# Patient Record
Sex: Male | Born: 1965 | Race: White | Hispanic: No | Marital: Single | Smoking: Former smoker
Health system: Southern US, Community
[De-identification: ages and names within clinical notes are randomized; demographics above are authoritative.]

## PROBLEM LIST (undated history)

## (undated) DIAGNOSIS — E785 Hyperlipidemia, unspecified: Secondary | ICD-10-CM

## (undated) HISTORY — PX: HERNIA REPAIR: SHX51

## (undated) HISTORY — DX: Hyperlipidemia, unspecified: E78.5

---

## 2020-09-30 ENCOUNTER — Other Ambulatory Visit: Payer: Self-pay

## 2020-10-02 ENCOUNTER — Ambulatory Visit: Payer: Managed Care, Other (non HMO) | Admitting: Medical

## 2020-10-02 ENCOUNTER — Other Ambulatory Visit: Payer: Self-pay

## 2020-10-02 ENCOUNTER — Ambulatory Visit (HOSPITAL_BASED_OUTPATIENT_CLINIC_OR_DEPARTMENT_OTHER)
Admission: RE | Admit: 2020-10-02 | Discharge: 2020-10-02 | Disposition: A | Discharge status: Home / Self Care | Payer: Managed Care, Other (non HMO) | Source: Ambulatory Visit | Attending: Medical | Admitting: Medical

## 2020-10-02 ENCOUNTER — Encounter: Payer: Self-pay | Admitting: Medical

## 2020-10-02 VITALS — BP 132/74 | HR 84 | Temp 97.9°F | Resp 18 | Ht 66.0 in | Wt 213.0 lb

## 2020-10-02 DIAGNOSIS — Z113 Encounter for screening for infections with a predominantly sexual mode of transmission: Secondary | ICD-10-CM

## 2020-10-02 DIAGNOSIS — R0609 Other forms of dyspnea: Secondary | ICD-10-CM

## 2020-10-02 DIAGNOSIS — R0600 Dyspnea, unspecified: Secondary | ICD-10-CM | POA: Diagnosis present

## 2020-10-02 DIAGNOSIS — Z1211 Encounter for screening for malignant neoplasm of colon: Secondary | ICD-10-CM | POA: Diagnosis not present

## 2020-10-02 DIAGNOSIS — Z125 Encounter for screening for malignant neoplasm of prostate: Secondary | ICD-10-CM | POA: Diagnosis not present

## 2020-10-02 DIAGNOSIS — Z23 Encounter for immunization: Secondary | ICD-10-CM | POA: Diagnosis not present

## 2020-10-02 DIAGNOSIS — Z0000 Encounter for general adult medical examination without abnormal findings: Secondary | ICD-10-CM

## 2020-10-02 NOTE — Addendum Note (Signed)
Addended by: Maximino Sarin on: 10/02/2020 03:04 PM   Modules accepted: Orders

## 2020-10-02 NOTE — Progress Notes (Signed)
Subjective:    Patient ID: Jerome Wilson, male    DOB: [birth year], 55 y.o.   MRN: [id number]  HPI  Pt in for the first time.  Pt states no pcp.   Pt works Primary school teacher. No exercise apart from work. Pt used to smoke. Stopped 22 months ago. But states occurs very random at times. Clarifies and states last event 3 weeks ago.  Pt states rare intermittent shortness of breath. Pt does not note any wheeze. This was last summer.Vey minimal shortness of breath as friend told him he looked mild sob.  No chest pain reported. No associated cardiac signs or symptoms.  Pt had j and j vaccine in past and he had omicron.   Pt will get tdap today.  Pt state fh dad and brother had cardiovascularl vascular disease Dad had mi. Passed away at 40 yo from stroke.    Review of Systems  Constitutional: Negative for chills, fatigue and fever.  HENT: Negative for congestion and drooling.   Respiratory: Negative for cough, chest tightness, shortness of breath and wheezing.   Cardiovascular: Negative for chest pain and palpitations.  Genitourinary: Negative for dysuria and frequency.  Musculoskeletal: Negative for back pain, joint swelling and myalgias.  Neurological: Negative for dizziness, syncope, light-headedness and headaches.  Hematological: Negative for adenopathy. Does not bruise/bleed easily.  Psychiatric/Behavioral: Negative for behavioral problems and confusion.    No past medical history on file.   Social History   Socioeconomic History  . Marital status: Single    Spouse name: Not on file  . Number of children: Not on file  . Years of education: Not on file  . Highest education level: Not on file  Occupational History  . Not on file  Tobacco Use  . Smoking status: Former Smoker    Packs/day: 1.00    Years: 17.00    Pack years: 17.00    Types: Cigarettes    Quit date: 12/18/2018    Years since quitting: 1.7  . Smokeless tobacco: Never Used  Vaping Use  . Vaping  Use: Never used  Substance and Sexual Activity  . Alcohol use: Yes    Comment: rare alcohol.   . Drug use: Never  . Sexual activity: Never  Other Topics Concern  . Not on file  Social History Narrative  . Not on file   Social Determinants of Health   Financial Resource Strain: Not on file  Food Insecurity: Not on file  Transportation Needs: Not on file  Physical Activity: Not on file  Stress: Not on file  Social Connections: Not on file  Intimate Partner Violence: Not on file     No family history on file.  Not on File  No current outpatient medications on file prior to visit.   No current facility-administered medications on file prior to visit.    BP 132/74   Pulse 84   Temp 97.9 F (36.6 C)   Resp 18   Ht 5\' 6"  (1.676 m)   Wt 213 lb (96.6 kg)   SpO2 98%   BMI 34.38 kg/m       Objective:   Physical Exam  General Mental Status- Alert. General Appearance- Not in acute distress.   Skin General: Color- Normal Color. Moisture- Normal Moisture.  Neck Carotid Arteries- Normal color. Moisture- Normal Moisture. No carotid bruits. No JVD.  Chest and Lung Exam Auscultation: Breath Sounds:-Normal.  Cardiovascular Auscultation:Rythm- Regular. Murmurs & Other Heart Sounds:Auscultation of the heart reveals- No Murmurs.  Abdomen  Inspection:-Inspeection Normal. Palpation/Percussion:Note:No mass. Palpation and Percussion of the abdomen reveal- Non Tender, Non Distended + BS, no rebound or guarding.    Neurologic Cranial Nerve exam:- CN III-XII intact(No nystagmus), symmetric smile. Strength:- 5/5 equal and symmetric strength both upper and lower extremities.      Assessment & Plan:  For you wellness exam today I have ordered cbc, cmp,lipid panel and psa. Future labs fasting.  Vaccine given today tdap.  Recommend exercise and healthy diet.  We will let you know lab results as they come in.  Follow up 10 days or as needed  For your rare dyspnea on  exertion we got ekg and chest xray. After lab review and follow up decide on if need to refer you to cardiologist.

## 2020-10-02 NOTE — Patient Instructions (Addendum)
For you wellness exam today I have ordered cbc, cmp,lipid panel and psa. Future labs fasting.  Vaccine given today tdap.  Recommend exercise and healthy diet.  We will let you know lab results as they come in.  Follow up 10 days or as needed  For your rare dyspnea on exertion we got ekg  nsr and chest xray. After lab review and follow up decide on if need to refer you to cardiologist.   Preventive Care 34-55 Years Old, Male Preventive care refers to lifestyle choices and visits with your health care provider that can promote health and wellness. This includes:  A yearly physical exam. This is also called an annual wellness visit.  Regular dental and eye exams.  Immunizations.  Screening for certain conditions.  Healthy lifestyle choices, such as: ? Eating a healthy diet. ? Getting regular exercise. ? Not using drugs or products that contain nicotine and tobacco. ? Limiting alcohol use. What can I expect for my preventive care visit? Physical exam Your health care provider will check your:  Height and weight. These may be used to calculate your BMI (body mass index). BMI is a measurement that tells if you are at a healthy weight.  Heart rate and blood pressure.  Body temperature.  Skin for abnormal spots. Counseling Your health care provider may ask you questions about your:  Past medical problems.  Family's medical history.  Alcohol, tobacco, and drug use.  Emotional well-being.  Home life and relationship well-being.  Sexual activity.  Diet, exercise, and sleep habits.  Work and work Astronomer.  Access to firearms. What immunizations do I need? Vaccines are usually given at various ages, according to a schedule. Your health care provider will recommend vaccines for you based on your age, medical history, and lifestyle or other factors, such as travel or where you work.   What tests do I need? Blood tests  Lipid and cholesterol levels. These may be  checked every 5 years, or more often if you are over 53 years old.  Hepatitis C test.  Hepatitis B test. Screening  Lung cancer screening. You may have this screening every year starting at age 55 if you have a 30-pack-year history of smoking and currently smoke or have quit within the past 15 years.  Prostate cancer screening. Recommendations will vary depending on your family history and other risks.  Genital exam to check for testicular cancer or hernias.  Colorectal cancer screening. ? All adults should have this screening starting at age 76 and continuing until age 46. ? Your health care provider may recommend screening at age 44 if you are at increased risk. ? You will have tests every 1-10 years, depending on your results and the type of screening test.  Diabetes screening. ? This is done by checking your blood sugar (glucose) after you have not eaten for a while (fasting). ? You may have this done every 1-3 years.  STD (sexually transmitted disease) testing, if you are at risk. Follow these instructions at home: Eating and drinking  Eat a diet that includes fresh fruits and vegetables, whole grains, lean protein, and low-fat dairy products.  Take vitamin and mineral supplements as recommended by your health care provider.  Do not drink alcohol if your health care provider tells you not to drink.  If you drink alcohol: ? Limit how much you have to 0-2 drinks a day. ? Be aware of how much alcohol is in your drink. In the [place] one drink  equals one 12 oz bottle of beer (355 mL), one 5 oz glass of wine (148 mL), or one 1 oz glass of hard liquor (44 mL).   Lifestyle  Take daily care of your teeth and gums. Brush your teeth every morning and night with fluoride toothpaste. Floss one time each day.  Stay active. Exercise for at least 30 minutes 5 or more days each week.  Do not use any products that contain nicotine or tobacco, such as cigarettes, e-cigarettes, and chewing  tobacco. If you need help quitting, ask your health care provider.  Do not use drugs.  If you are sexually active, practice safe sex. Use a condom or other form of protection to prevent STIs (sexually transmitted infections).  If told by your health care provider, take low-dose aspirin daily starting at age 96.  Find healthy ways to cope with stress, such as: ? Meditation, yoga, or listening to music. ? Journaling. ? Talking to a trusted person. ? Spending time with friends and family. Safety  Always wear your seat belt while driving or riding in a vehicle.  Do not drive: ? If you have been drinking alcohol. Do not ride with someone who has been drinking. ? When you are tired or distracted. ? While texting.  Wear a helmet and other protective equipment during sports activities.  If you have firearms in your house, make sure you follow all gun safety procedures. What's next?  Go to your health care provider once a year for an annual wellness visit.  Ask your health care provider how often you should have your eyes and teeth checked.  Stay up to date on all vaccines. This information is not intended to replace advice given to you by your health care provider. Make sure you discuss any questions you have with your health care provider. Document Revised: 04/23/2019 Document Reviewed: 07/19/2018 Elsevier Patient Education  2021 [employer].

## 2020-10-03 ENCOUNTER — Telehealth: Payer: Self-pay | Admitting: Medical

## 2020-10-03 MED ORDER — ALBUTEROL SULFATE HFA 108 (90 BASE) MCG/ACT IN AERS
2.0000 | INHALATION_SPRAY | Freq: Four times a day (QID) | RESPIRATORY_TRACT | 0 refills | Status: DC | PRN
Start: 2020-10-03 — End: 2020-10-28

## 2020-10-03 NOTE — Telephone Encounter (Signed)
Rx albuterol inhaler sent to pt pharmacy. 

## 2020-10-06 ENCOUNTER — Other Ambulatory Visit: Payer: Self-pay

## 2020-10-06 ENCOUNTER — Encounter: Payer: Self-pay | Admitting: Gastroenterology

## 2020-10-06 ENCOUNTER — Other Ambulatory Visit (INDEPENDENT_AMBULATORY_CARE_PROVIDER_SITE_OTHER): Payer: Managed Care, Other (non HMO)

## 2020-10-06 ENCOUNTER — Telehealth: Payer: Self-pay | Admitting: Medical

## 2020-10-06 DIAGNOSIS — E785 Hyperlipidemia, unspecified: Secondary | ICD-10-CM

## 2020-10-06 DIAGNOSIS — Z125 Encounter for screening for malignant neoplasm of prostate: Secondary | ICD-10-CM | POA: Diagnosis not present

## 2020-10-06 DIAGNOSIS — Z0000 Encounter for general adult medical examination without abnormal findings: Secondary | ICD-10-CM | POA: Diagnosis not present

## 2020-10-06 DIAGNOSIS — Z113 Encounter for screening for infections with a predominantly sexual mode of transmission: Secondary | ICD-10-CM

## 2020-10-06 DIAGNOSIS — Z8249 Family history of ischemic heart disease and other diseases of the circulatory system: Secondary | ICD-10-CM

## 2020-10-06 DIAGNOSIS — R0609 Other forms of dyspnea: Secondary | ICD-10-CM

## 2020-10-06 DIAGNOSIS — R0600 Dyspnea, unspecified: Secondary | ICD-10-CM

## 2020-10-06 LAB — CBC WITH DIFFERENTIAL/PLATELET
Basophils Absolute: 0.1 10*3/uL (ref 0.0–0.1)
Basophils Relative: 1.2 % (ref 0.0–3.0)
Eosinophils Absolute: 0.3 10*3/uL (ref 0.0–0.7)
Eosinophils Relative: 7.4 % — ABNORMAL HIGH (ref 0.0–5.0)
HCT: 43.9 % (ref 39.0–52.0)
Hemoglobin: 14.9 g/dL (ref 13.0–17.0)
Lymphocytes Relative: 29.8 % (ref 12.0–46.0)
Lymphs Abs: 1.3 10*3/uL (ref 0.7–4.0)
MCHC: 33.9 g/dL (ref 30.0–36.0)
MCV: 85.6 fl (ref 78.0–100.0)
Monocytes Absolute: 0.6 10*3/uL (ref 0.1–1.0)
Monocytes Relative: 14.7 % — ABNORMAL HIGH (ref 3.0–12.0)
Neutro Abs: 2 10*3/uL (ref 1.4–7.7)
Neutrophils Relative %: 46.9 % (ref 43.0–77.0)
Platelets: 168 10*3/uL (ref 150.0–400.0)
RBC: 5.13 Mil/uL (ref 4.22–5.81)
RDW: 13.4 % (ref 11.5–15.5)
WBC: 4.2 10*3/uL (ref 4.0–10.5)

## 2020-10-06 LAB — COMPREHENSIVE METABOLIC PANEL
ALT: 38 U/L (ref 0–53)
AST: 24 U/L (ref 0–37)
Albumin: 4.4 g/dL (ref 3.5–5.2)
Alkaline Phosphatase: 47 U/L (ref 39–117)
BUN: 16 mg/dL (ref 6–23)
CO2: 28 mEq/L (ref 19–32)
Calcium: 9.4 mg/dL (ref 8.4–10.5)
Chloride: 103 mEq/L (ref 96–112)
Creatinine, Ser: 1.09 mg/dL (ref 0.40–1.50)
GFR: 77 mL/min (ref >60.00)
Glucose, Bld: 103 mg/dL — ABNORMAL HIGH (ref 70–99)
Potassium: 4.4 mEq/L (ref 3.5–5.1)
Sodium: 139 mEq/L (ref 135–145)
Total Bilirubin: 0.6 mg/dL (ref 0.2–1.2)
Total Protein: 7.1 g/dL (ref 6.0–8.3)

## 2020-10-06 LAB — LIPID PANEL
Cholesterol: 203 mg/dL — ABNORMAL HIGH (ref 0–200)
HDL: 30.5 mg/dL — ABNORMAL LOW (ref >39.00)
NonHDL: 172.67
Total CHOL/HDL Ratio: 7
Triglycerides: 323 mg/dL — ABNORMAL HIGH (ref 0.0–149.0)
VLDL: 64.6 mg/dL — ABNORMAL HIGH (ref 0.0–40.0)

## 2020-10-06 LAB — PSA: PSA: 1.49 ng/mL (ref 0.10–4.00)

## 2020-10-06 LAB — LDL CHOLESTEROL, DIRECT: Direct LDL: 117 mg/dL

## 2020-10-06 MED ORDER — ATORVASTATIN CALCIUM 10 MG PO TABS: 10.0000 mg | ORAL_TABLET | Freq: Every day | ORAL | 3 refills | Status: DC

## 2020-10-06 NOTE — Telephone Encounter (Signed)
Atorvastatin sent to pt pharmacy. 

## 2020-10-06 NOTE — Telephone Encounter (Signed)
Referral to cardiologist placed. 

## 2020-10-07 LAB — HIV ANTIBODY (ROUTINE TESTING W REFLEX): HIV 1&2 Ab, 4th Generation: NONREACTIVE

## 2020-10-12 ENCOUNTER — Ambulatory Visit: Payer: Managed Care, Other (non HMO) | Admitting: Medical

## 2020-10-12 DIAGNOSIS — Z0289 Encounter for other administrative examinations: Secondary | ICD-10-CM

## 2020-10-13 ENCOUNTER — Other Ambulatory Visit: Payer: Self-pay

## 2020-10-13 ENCOUNTER — Encounter: Payer: Self-pay | Admitting: Medical

## 2020-10-13 ENCOUNTER — Ambulatory Visit: Payer: Managed Care, Other (non HMO) | Admitting: Medical

## 2020-10-13 VITALS — BP 132/89 | HR 97 | Resp 18 | Ht 66.0 in | Wt 207.6 lb

## 2020-10-13 DIAGNOSIS — R739 Hyperglycemia, unspecified: Secondary | ICD-10-CM

## 2020-10-13 DIAGNOSIS — E785 Hyperlipidemia, unspecified: Secondary | ICD-10-CM

## 2020-10-13 NOTE — Progress Notes (Signed)
Subjective:    Patient ID: Jerome Wilson, male    DOB: 10-28-1965, 55 y.o.   MRN: [id number]  HPI  Pt in for follow up.  Pt had labs done. I had explained pt 10 year cardiovascular risk score and we had discussed pt family history. He wants to avoid statin. He wants to eat better first.  The 10-year ASCVD risk score Denman George DC Montez Hageman., et al., 2013) is: 7.6%   Values used to calculate the score:     Age: 20 years     Sex: Male     Is Non-Hispanic African American: No     Diabetic: No     Tobacco smoker: No     Systolic Blood Pressure: 123 mmHg     Is BP treated: No     HDL Cholesterol: 30.5 mg/dL     Total Cholesterol: 203 mg/dL    Pt has mild sugar elevation in the past.   I did put in referral to cardiologist. Did discuss I think good idea to get cardiologist opinion.     Review of Systems  Constitutional: Negative for chills, fatigue and fever.  HENT: Negative for congestion and drooling.   Respiratory: Negative for cough, chest tightness, shortness of breath and wheezing.   Cardiovascular: Negative for chest pain and palpitations.  Gastrointestinal: Negative for abdominal pain, blood in stool, diarrhea, nausea and rectal pain.  Genitourinary: Negative for dysuria.  Musculoskeletal: Negative for back pain and neck pain.  Skin: Negative for rash.  Neurological: Negative for dizziness, numbness and headaches.  Hematological: Negative for adenopathy. Does not bruise/bleed easily.  Psychiatric/Behavioral: Negative for behavioral problems, confusion and decreased concentration. The patient is not nervous/anxious and is not hyperactive.    No past medical history on file.   Social History   Socioeconomic History  . Marital status: Single    Spouse name: Not on file  . Number of children: Not on file  . Years of education: Not on file  . Highest education level: Not on file  Occupational History  . Not on file  Tobacco Use  . Smoking status: Former Smoker     Packs/day: 1.00    Years: 17.00    Pack years: 17.00    Types: Cigarettes    Quit date: 12/18/2018    Years since quitting: 1.8  . Smokeless tobacco: Never Used  Vaping Use  . Vaping Use: Never used  Substance and Sexual Activity  . Alcohol use: Yes    Comment: rare alcohol.   . Drug use: Never  . Sexual activity: Never  Other Topics Concern  . Not on file  Social History Narrative  . Not on file   Social Determinants of Health   Financial Resource Strain: Not on file  Food Insecurity: Not on file  Transportation Needs: Not on file  Physical Activity: Not on file  Stress: Not on file  Social Connections: Not on file  Intimate Partner Violence: Not on file     No family history on file.  Not on File  Current Outpatient Medications on File Prior to Visit  Medication Sig Dispense Refill  . albuterol (VENTOLIN HFA) 108 (90 Base) MCG/ACT inhaler Inhale 2 puffs into the lungs every 6 (six) hours as needed. 18 g 0  . atorvastatin (LIPITOR) 10 MG tablet Take 1 tablet (10 mg total) by mouth daily. 30 tablet 3   No current facility-administered medications on file prior to visit.    BP (!) 123/101  Pulse 97   Resp 18   Ht 5\' 6"  (1.676 m)   Wt 207 lb 9.6 oz (94.2 kg)   SpO2 96%   BMI 33.51 kg/m       Objective:   Physical Exam   General- No acute distress. Pleasant patient. Neck- Full range of motion, no jvd Lungs- Clear, even and unlabored. Heart- regular rate and rhythm. Neurologic- CNII- XII grossly intact.        Assessment & Plan:  For your high cholesterol recommend low cholesterol diet and mild exercise. I prescribed statin but you are hesitant to use. In light of dad medical history will refer to cardiologist in light of rare sob as we discussed on last visit.  For elevated sugar recommend low sugar diet.   For weight loss recommend weight watchers.  Follow up in 3-4 months or as needed

## 2020-10-13 NOTE — Patient Instructions (Addendum)
For your high cholesterol recommend low cholesterol diet and mild exercise. I prescribed statin but you are hesitant to use. In light of dad medical history will refer to cardiologist in light of rare sob as we discussed on last visit.  For elevated sugar recommend low sugar diet.   For weight loss recommend weight watchers.  Follow up in 3-4 months or as needed  Future cmp and lipid panel placed.Please schedule lab in 3 months.

## 2020-10-28 ENCOUNTER — Other Ambulatory Visit: Payer: Self-pay

## 2020-10-28 ENCOUNTER — Ambulatory Visit: Payer: Managed Care, Other (non HMO) | Admitting: Cardiology

## 2020-10-28 ENCOUNTER — Encounter: Payer: Self-pay | Admitting: Cardiology

## 2020-10-28 VITALS — BP 118/84 | HR 98 | Ht 66.0 in | Wt 208.0 lb

## 2020-10-28 DIAGNOSIS — E669 Obesity, unspecified: Secondary | ICD-10-CM | POA: Insufficient documentation

## 2020-10-28 DIAGNOSIS — R0602 Shortness of breath: Secondary | ICD-10-CM | POA: Diagnosis not present

## 2020-10-28 DIAGNOSIS — E785 Hyperlipidemia, unspecified: Secondary | ICD-10-CM

## 2020-10-28 DIAGNOSIS — E781 Pure hyperglyceridemia: Secondary | ICD-10-CM | POA: Insufficient documentation

## 2020-10-28 NOTE — Progress Notes (Signed)
Cardiology Office Note:    Date:  10/28/2020   ID:  Jerome PunaJohnny Alia, DOB 06/11/1966, MRN [id number]  PCP:  Esperanza RichtersSaguier, Edward, PA-C  Cardiologist:  Thomasene RippleKardie Tayra Dawe, DO  Electrophysiologist:  None   Referring MD: Esperanza RichtersSaguier, Edward, PA-C   Result her shortness of breath which seems to be improving  History of Present Illness:    Jerome Wilson is a 55 y.o. male with a hx of hyperlipidemia, former smoker, family history of heart disease in his father at the age of 55, heart disease in his brother, obesity here today to be evaluated for shortness of breath.  The patient tells me that several months ago he had been experiencing significant shortness of breath which recently he is gotten little active and is improving.  He know he discussed with his PCP who recommended he see cardiology.  He denies any chest pain any lightheadedness and dizziness.  Past Medical History:  Diagnosis Date  . Hyperlipidemia     Past Surgical History:  Procedure Laterality Date  . HERNIA REPAIR      Current Medications: No outpatient medications have been marked as taking for the 10/28/20 encounter (Office Visit) with Thomasene Rippleobb, Sheridan Hew, DO.     Allergies:   Patient has no known allergies.   Social History   Socioeconomic History  . Marital status: Single    Spouse name: Not on file  . Number of children: Not on file  . Years of education: Not on file  . Highest education level: Not on file  Occupational History  . Not on file  Tobacco Use  . Smoking status: Former Smoker    Packs/day: 1.00    Years: 17.00    Pack years: 17.00    Types: Cigarettes    Quit date: 12/18/2018    Years since quitting: 1.8  . Smokeless tobacco: Never Used  Vaping Use  . Vaping Use: Never used  Substance and Sexual Activity  . Alcohol use: Yes    Comment: rare alcohol.   . Drug use: Never  . Sexual activity: Never  Other Topics Concern  . Not on file  Social History Narrative  . Not on file   Social Determinants of  Health   Financial Resource Strain: Not on file  Food Insecurity: Not on file  Transportation Needs: Not on file  Physical Activity: Not on file  Stress: Not on file  Social Connections: Not on file     Family History: The patient's family history includes Arthritis in his sister; Heart attack in his father; Heart disease in his brother and mother; Stroke (age of onset: 6355) in his father.  ROS:   Review of Systems  Constitution: Negative for decreased appetite, fever and weight gain.  HENT: Negative for congestion, ear discharge, hoarse voice and sore throat.   Eyes: Negative for discharge, redness, vision loss in right eye and visual halos.  Cardiovascular: Negative for chest pain, dyspnea on exertion, leg swelling, orthopnea and palpitations.  Respiratory: Negative for cough, hemoptysis, shortness of breath and snoring.   Endocrine: Negative for heat intolerance and polyphagia.  Hematologic/Lymphatic: Negative for bleeding problem. Does not bruise/bleed easily.  Skin: Negative for flushing, nail changes, rash and suspicious lesions.  Musculoskeletal: Negative for arthritis, joint pain, muscle cramps, myalgias, neck pain and stiffness.  Gastrointestinal: Negative for abdominal pain, bowel incontinence, diarrhea and excessive appetite.  Genitourinary: Negative for decreased libido, genital sores and incomplete emptying.  Neurological: Negative for brief paralysis, focal weakness, headaches and loss of balance.  Psychiatric/Behavioral: Negative for altered mental status, depression and suicidal ideas.  Allergic/Immunologic: Negative for HIV exposure and persistent infections.    EKGs/Labs/Other Studies Reviewed:    The following studies were reviewed today:   EKG: None today but I reviewed his EKG which was done on October 02, 2020 sinus rhythm no ST segment changes.  Recent Labs: 10/06/2020: ALT 38; BUN 16; Creatinine, Ser 1.09; Hemoglobin 14.9; Platelets 168.0; Potassium 4.4;  Sodium 139  Recent Lipid Panel    Component Value Date/Time   CHOL 203 (H) 10/06/2020 0920   TRIG 323.0 (H) 10/06/2020 0920   HDL 30.50 (L) 10/06/2020 0920   CHOLHDL 7 10/06/2020 0920   VLDL 64.6 (H) 10/06/2020 0920   LDLDIRECT 117.0 10/06/2020 0920    Physical Exam:    VS:  BP 118/84   Pulse 98   Ht 5\' 6"  (1.676 m)   Wt 208 lb (94.3 kg)   SpO2 96%   BMI 33.57 kg/m     Wt Readings from Last 3 Encounters:  10/28/20 208 lb (94.3 kg)  10/13/20 207 lb 9.6 oz (94.2 kg)  10/02/20 213 lb (96.6 kg)     GEN: Well nourished, well developed in no acute distress HEENT: Normal NECK: No JVD; No carotid bruits LYMPHATICS: No lymphadenopathy CARDIAC: S1S2 noted,RRR, no murmurs, rubs, gallops RESPIRATORY:  Clear to auscultation without rales, wheezing or rhonchi  ABDOMEN: Soft, non-tender, non-distended, +bowel sounds, no guarding. EXTREMITIES: No edema, No cyanosis, no clubbing MUSCULOSKELETAL:  No deformity  SKIN: Warm and dry NEUROLOGIC:  Alert and oriented x 3, non-focal PSYCHIATRIC:  Normal affect, good insight  ASSESSMENT:    1. Hyperlipidemia, unspecified hyperlipidemia type   2. Hypertriglyceridemia   3. Shortness of breath   4. Obesity (BMI 30-39.9)    PLAN:     For shortness of breath, echocardiogram will also be done to assess LV/RV function and any other structure abnormalities.  This also help understand his right and left-sided pressures given his history of smoking.  He has no other typical anginal symptoms we will continue to monitor.  And if needed we will reassess the need for any further ischemic evaluation.  He does have dyslipidemia with hypertriglyceridemia total triglycerides 323, HDL 30, total cholesterol 10/04/20 LDL 117.  He was recently started on Lipitor by his PCP.  Per the patient at this point will rather hold off on medication and like to cut back on new things in his life.  We discussed he is heavily on sweets as well as sometimes drinks beer he is can  call out this back and hopefully see if this is going to help.  He intends to improve his diet and also start exercising.  This is not unreasonable he is going to get his repeat blood work done and hopefully see if this is going to help.  The patient understands the need to lose weight with diet and exercise. We have discussed specific strategies for this.  The patient is in agreement with the above plan. The patient left the office in stable condition.  The patient will follow up in 6 months or sooner if needed.  Medication Adjustments/Labs and Tests Ordered: Current medicines are reviewed at length with the patient today.  Concerns regarding medicines are outlined above.  No orders of the defined types were placed in this encounter.  No orders of the defined types were placed in this encounter.   Patient Instructions  Medication Instructions:  Your physician recommends that you continue on  your current medications as directed. Please refer to the Current Medication list given to you today.  *If you need a refill on your cardiac medications before your next appointment, please call your pharmacy*   Lab Work: None If you have labs (blood work) drawn today and your tests are completely normal, you will receive your results only by: Marland Kitchen MyChart Message (if you have MyChart) OR . A paper copy in the mail If you have any lab test that is abnormal or we need to change your treatment, we will call you to review the results.   Testing/Procedures: Your physician has requested that you have an echocardiogram. Echocardiography is a painless test that uses sound waves to create images of your heart. It provides your doctor with information about the size and shape of your heart and how well your heart's chambers and valves are working. This procedure takes approximately one hour. There are no restrictions for this procedure.    Follow-Up: At [facility], you and your health needs are our  priority.  As part of our continuing mission to provide you with exceptional heart care, we have created designated Provider Care Teams.  These Care Teams include your primary Cardiologist (physician) and Advanced Practice Providers (APPs -  Physician Assistants and Nurse Practitioners) who all work together to provide you with the care you need, when you need it.  We recommend signing up for the patient portal called "MyChart".  Sign up information is provided on this After Visit Summary.  MyChart is used to connect with patients for Virtual Visits (Telemedicine).  Patients are able to view lab/test results, encounter notes, upcoming appointments, etc.  Non-urgent messages can be sent to your provider as well.   To learn more about what you can do with MyChart, go to [email or web address].    Your next appointment:   6 month(s)  The format for your next appointment:   In Person  Provider:   Thomasene Ripple, DO   Other Instructions      Adopting a Healthy Lifestyle.  Know what a healthy weight is for you (roughly BMI <25) and aim to maintain this   Aim for 7+ servings of fruits and vegetables daily   65-80+ fluid ounces of water or unsweet tea for healthy kidneys   Limit to max 1 drink of alcohol per day; avoid smoking/tobacco   Limit animal fats in diet for cholesterol and heart health - choose grass fed whenever available   Avoid highly processed foods, and foods high in saturated/trans fats   Aim for low stress - take time to unwind and care for your mental health   Aim for 150 min of moderate intensity exercise weekly for heart health, and weights twice weekly for bone health   Aim for 7-9 hours of sleep daily   When it comes to diets, agreement about the perfect plan isnt easy to find, even among the experts. Experts at the [facility] [employer] developed an idea known as the Healthy Eating Plate. Just imagine a plate divided into logical, healthy portions.    The emphasis is on diet quality:   Load up on vegetables and fruits - one-half of your plate: Aim for color and variety, and remember that potatoes dont count.   Go for whole grains - one-quarter of your plate: Whole wheat, barley, wheat berries, quinoa, oats, brown rice, and foods made with them. If you want pasta, go with whole wheat pasta.   Protein power -  one-quarter of your plate: Fish, chicken, beans, and nuts are all healthy, versatile protein sources. Limit red meat.   The diet, however, does go beyond the plate, offering a few other suggestions.   Use healthy plant oils, such as olive, canola, soy, corn, sunflower and peanut. Check the labels, and avoid partially hydrogenated oil, which have unhealthy trans fats.   If youre thirsty, drink water. Coffee and tea are good in moderation, but skip sugary drinks and limit milk and dairy products to one or two daily servings.   The type of carbohydrate in the diet is more important than the amount. Some sources of carbohydrates, such as vegetables, fruits, whole grains, and beans-are healthier than others.   Finally, stay active  Signed, Thomasene Ripple, DO  10/28/2020 2:50 PM    [facility]

## 2020-10-28 NOTE — Patient Instructions (Signed)
Medication Instructions:  Your physician recommends that you continue on your current medications as directed. Please refer to the Current Medication list given to you today.  *If you need a refill on your cardiac medications before your next appointment, please call your pharmacy*   Lab Work: None If you have labs (blood work) drawn today and your tests are completely normal, you will receive your results only by: . MyChart Message (if you have MyChart) OR . A paper copy in the mail If you have any lab test that is abnormal or we need to change your treatment, we will call you to review the results.   Testing/Procedures: Your physician has requested that you have an echocardiogram. Echocardiography is a painless test that uses sound waves to create images of your heart. It provides your doctor with information about the size and shape of your heart and how well your heart's chambers and valves are working. This procedure takes approximately one hour. There are no restrictions for this procedure.     Follow-Up: At [facility], you and your health needs are our priority.  As part of our continuing mission to provide you with exceptional heart care, we have created designated Provider Care Teams.  These Care Teams include your primary Cardiologist (physician) and Advanced Practice Providers (APPs -  Physician Assistants and Nurse Practitioners) who all work together to provide you with the care you need, when you need it.  We recommend signing up for the patient portal called "MyChart".  Sign up information is provided on this After Visit Summary.  MyChart is used to connect with patients for Virtual Visits (Telemedicine).  Patients are able to view lab/test results, encounter notes, upcoming appointments, etc.  Non-urgent messages can be sent to your provider as well.   To learn more about what you can do with MyChart, go to [email or web address].    Your next appointment:   6  month(s)  The format for your next appointment:   In Person  Provider:   Kardie Tobb, DO   Other Instructions   

## 2020-12-02 ENCOUNTER — Ambulatory Visit (HOSPITAL_BASED_OUTPATIENT_CLINIC_OR_DEPARTMENT_OTHER)
Admission: RE | Admit: 2020-12-02 | Discharge: 2020-12-02 | Disposition: A | Discharge status: Home / Self Care | Payer: Managed Care, Other (non HMO) | Source: Ambulatory Visit | Attending: Cardiology | Admitting: Cardiology

## 2020-12-02 ENCOUNTER — Other Ambulatory Visit (HOSPITAL_BASED_OUTPATIENT_CLINIC_OR_DEPARTMENT_OTHER): Payer: Managed Care, Other (non HMO)

## 2020-12-02 ENCOUNTER — Other Ambulatory Visit: Payer: Self-pay

## 2020-12-02 DIAGNOSIS — R0602 Shortness of breath: Secondary | ICD-10-CM | POA: Insufficient documentation

## 2020-12-02 NOTE — Progress Notes (Signed)
  Echocardiogram 2D Echocardiogram has been performed.  Jerome Wilson 12/02/2020, 1:07 PM

## 2020-12-03 LAB — ECHOCARDIOGRAM COMPLETE
Area-P 1/2: 3.47 cm2
Calc EF: 57.8 %
S' Lateral: 2.63 cm
Single Plane A2C EF: 59.1 %
Single Plane A4C EF: 55.6 %

## 2020-12-04 ENCOUNTER — Encounter: Payer: Managed Care, Other (non HMO) | Admitting: Gastroenterology

## 2021-04-16 ENCOUNTER — Ambulatory Visit: Payer: Managed Care, Other (non HMO) | Admitting: Cardiology

## 2021-07-16 ENCOUNTER — Other Ambulatory Visit (HOSPITAL_BASED_OUTPATIENT_CLINIC_OR_DEPARTMENT_OTHER): Payer: Self-pay

## 2021-07-16 ENCOUNTER — Ambulatory Visit: Payer: Managed Care, Other (non HMO) | Attending: Internal Medicine

## 2021-07-16 DIAGNOSIS — Z23 Encounter for immunization: Secondary | ICD-10-CM

## 2021-07-16 MED ORDER — PFIZER COVID-19 VAC BIVALENT 30 MCG/0.3ML IM SUSP
INTRAMUSCULAR | 0 refills | Status: AC
  Filled 2021-07-16: qty 0.3, 1d supply, fill #0

## 2021-07-16 NOTE — Progress Notes (Signed)
   Covid-19 Vaccination Clinic  Name:  Jerome Wilson    MRN: [id number] DOB: Jan 21, 1966  07/16/2021  Mr. [name] was observed post Covid-19 immunization for 15 minutes without incident. He was provided with Vaccine Information Sheet and instruction to access the V-Safe system.   Mr. [name] was instructed to call 911 with any severe reactions post vaccine: Difficulty breathing  Swelling of face and throat  A fast heartbeat  A bad rash all over body  Dizziness and weakness   Immunizations Administered     Name Date Dose VIS Date Route   Pfizer Covid-19 Vaccine Bivalent Booster 07/16/2021  2:11 PM 0.3 mL 04/07/2021 Intramuscular   Manufacturer: [employer], [employer]   Lot: [id number]   NDC: [phone]

## 2022-01-31 IMAGING — DX DG CHEST 2V
2 series · 2 of 2 positions shown · non-contrast
Comparison: None.

CLINICAL DATA: Dyspnea on exertion.

EXAM:
CHEST - 2 VIEW

[chest pa]
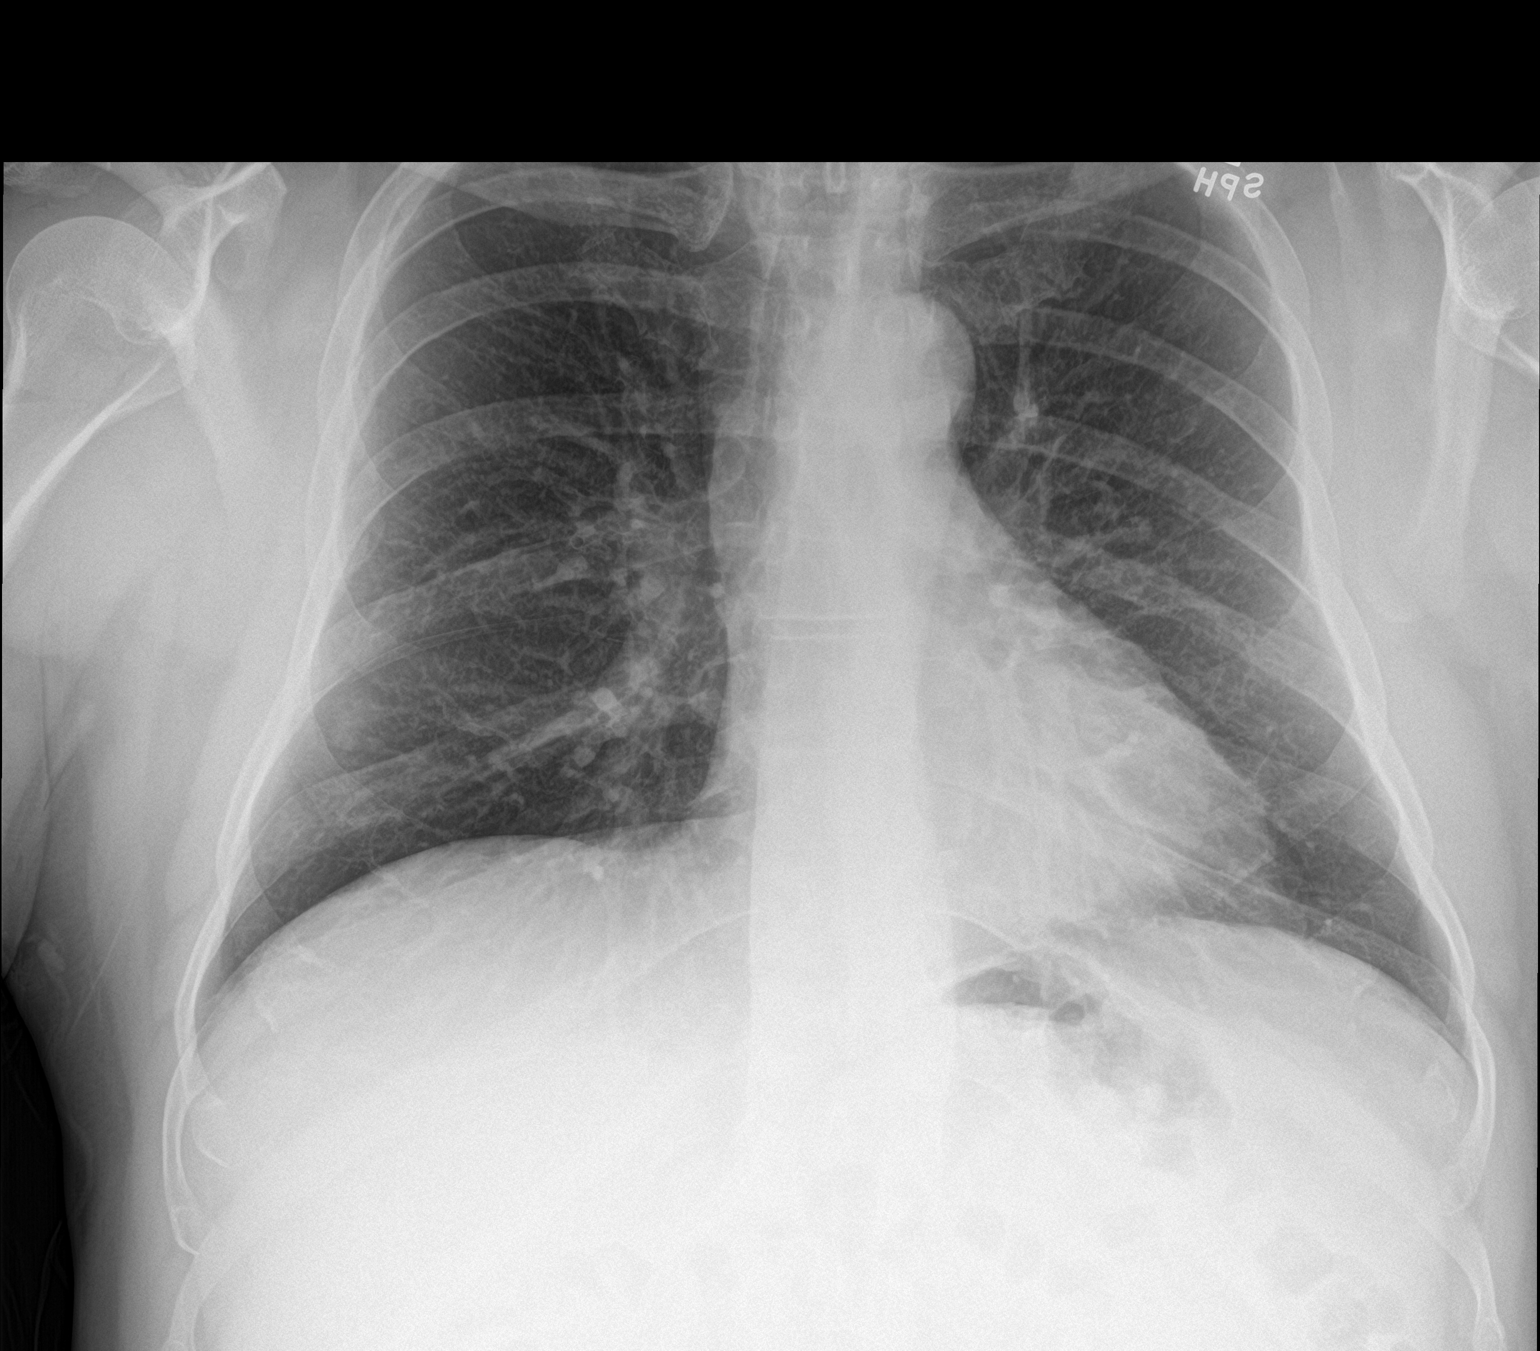

[chest lat]
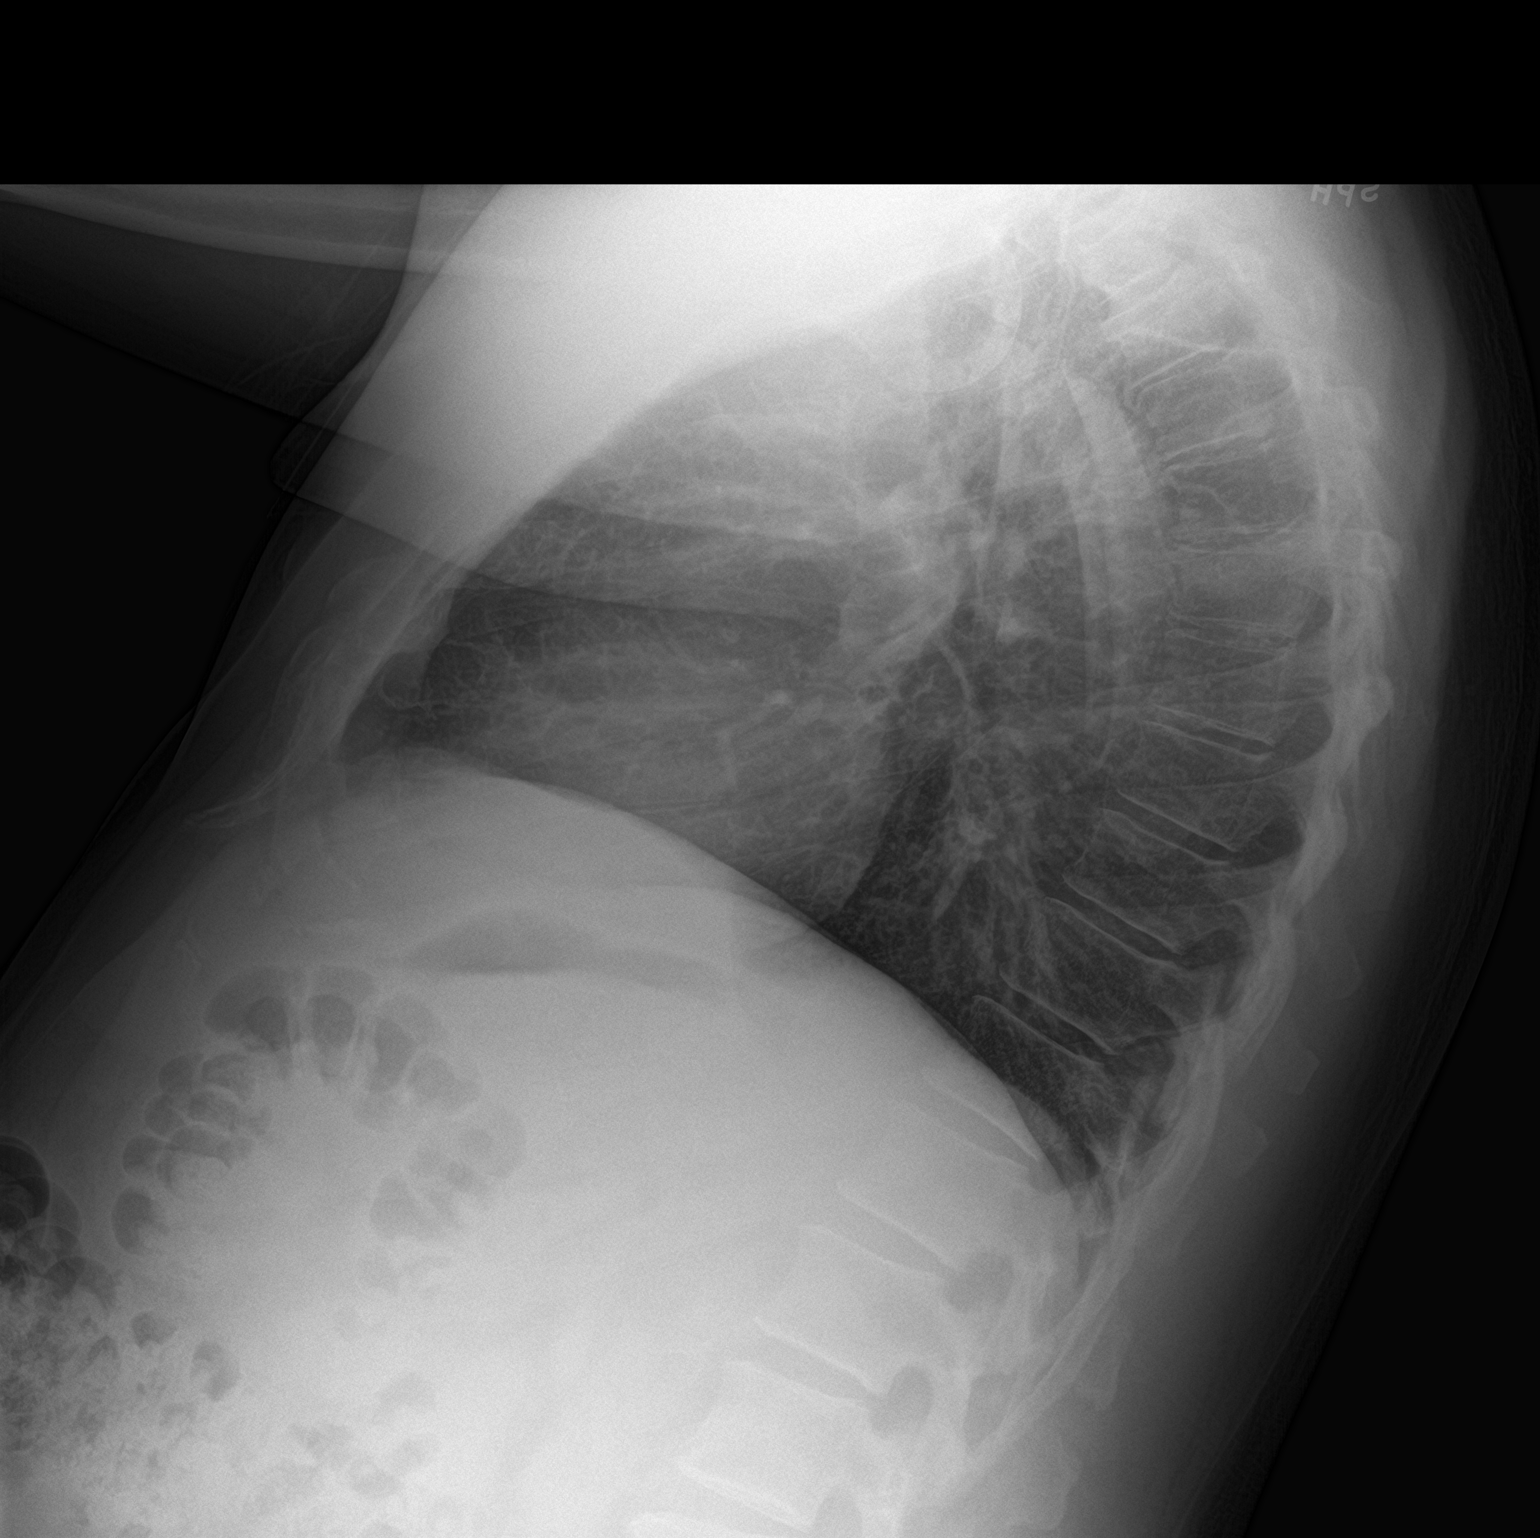

[2 of 2 positions shown; findings below may reference images not displayed]

FINDINGS: The heart size and mediastinal contours are within normal limits.
Both lungs are clear. No pneumothorax or pleural effusion is noted.
The visualized skeletal structures are unremarkable.
IMPRESSION: No active cardiopulmonary disease.
